# Patient Record
Sex: Male | Born: 1985 | Race: White | Hispanic: No | Marital: Single | Smoking: Never smoker
Health system: Southern US, Community
[De-identification: ages and names within clinical notes are randomized; demographics above are authoritative.]

## PROBLEM LIST (undated history)

## (undated) DIAGNOSIS — L709 Acne, unspecified: Secondary | ICD-10-CM

## (undated) HISTORY — PX: WISDOM TOOTH EXTRACTION: SHX21

## (undated) HISTORY — PX: LASIK: SHX215

---

## 2014-10-26 ENCOUNTER — Encounter: Payer: Self-pay | Admitting: *Deleted

## 2014-10-26 ENCOUNTER — Emergency Department
Admission: EM | Admit: 2014-10-26 | Discharge: 2014-10-26 | Disposition: A | Discharge status: Home / Self Care | Payer: Federal, State, Local not specified - PPO | Source: Home / Self Care | Attending: Family Medicine | Admitting: Family Medicine

## 2014-10-26 ENCOUNTER — Emergency Department (INDEPENDENT_AMBULATORY_CARE_PROVIDER_SITE_OTHER): Payer: Federal, State, Local not specified - PPO

## 2014-10-26 DIAGNOSIS — S7012XA Contusion of left thigh, initial encounter: Secondary | ICD-10-CM | POA: Diagnosis not present

## 2014-10-26 DIAGNOSIS — S5002XA Contusion of left elbow, initial encounter: Secondary | ICD-10-CM

## 2014-10-26 DIAGNOSIS — S7002XA Contusion of left hip, initial encounter: Secondary | ICD-10-CM

## 2014-10-26 DIAGNOSIS — M25522 Pain in left elbow: Secondary | ICD-10-CM | POA: Diagnosis not present

## 2014-10-26 HISTORY — DX: Acne, unspecified: L70.9

## 2014-10-26 NOTE — ED Notes (Signed)
Pt c/o LT arm, elbow and wrist pain post fall while running yesterday. He also reports a bruise on his LT hip.

## 2014-10-26 NOTE — Discharge Instructions (Signed)
Apply ice pack for 20 to 30 minutes, 3 to 4 times daily  Continue until swelling decreases.  Wear sling and splint until follow-up visit.  May take Ibuprofen , 4 tabs every 8 hours with food.    Elbow Contusion  An elbow contusion is a deep bruise of the elbow. Contusions happen when an injury causes bleeding under the skin. Signs of bruising include pain, puffiness (swelling), and discolored skin. The contusion may turn blue, purple, or yellow. HOME CARE  Put ice on the injured area.  Put ice in a plastic bag.  Place a towel between your skin and the bag.  Leave the ice on for 15-20 minutes, 03-04 times a day.  Only take medicines as told by your doctor.  Rest your elbow until the pain and puffiness are better.  Raise (elevate) your elbow to lessen puffiness.  Put on an elastic wrap as told by your doctor. You can take it off for sleeping, showers, and baths. If your fingers get cold, blue, or lose feeling (numb), take the wrap off. Put the wrap back on more loosely.  Use your elbow only as told by your doctor. If you are asked to do elbow exercises, do them as told.  Keep all doctor visits as told. GET HELP RIGHT AWAY IF:  You have more redness, puffiness, or pain in your elbow.  Your puffiness or pain is not helped by medicines.  You have puffiness of the hand and fingers.  You are not able to move your fingers or wrist.  You start to lose feeling in your hand or fingers.  Your fingers or hand become cold or blue. MAKE SURE YOU:   Understand these instructions.  Will watch your condition.  Will get help right away if you are not doing well or get worse. Document Released: 01/05/2011 Document Revised: 07/18/2011 Document Reviewed: 01/05/2011 [facility] Patient Information 2015 [place], [place]. This information is not intended to replace advice given to you by your health care provider. Make sure you discuss any questions you have with your health care  provider.   Hematoma A hematoma is a collection of blood under the skin, in an organ, in a body space, in a joint space, or in other tissue. The blood can clot to form a lump that you can see and feel. The lump is often firm and may sometimes become sore and tender. Most hematomas get better in a few days to weeks. However, some hematomas may be serious and require medical care. Hematomas can range in size from very small to very large. CAUSES  A hematoma can be caused by a blunt or penetrating injury. It can also be caused by spontaneous leakage from a blood vessel under the skin. Spontaneous leakage from a blood vessel is more likely to occur in older people, especially those taking blood thinners. Sometimes, a hematoma can develop after certain medical procedures. SIGNS AND SYMPTOMS   A firm lump on the body.  Possible pain and tenderness in the area.  Bruising.Blue, dark blue, purple-red, or yellowish skin may appear at the site of the hematoma if the hematoma is close to the surface of the skin. For hematomas in deeper tissues or body spaces, the signs and symptoms may be subtle. For example, an intra-abdominal hematoma may cause abdominal pain, weakness, fainting, and shortness of breath. An intracranial hematoma may cause a headache or symptoms such as weakness, trouble speaking, or a change in consciousness. DIAGNOSIS  A hematoma can usually  be diagnosed based on your medical history and a physical exam. Imaging tests may be needed if your health care provider suspects a hematoma in deeper tissues or body spaces, such as the abdomen, head, or chest. These tests may include ultrasonography or a CT scan.  TREATMENT  Hematomas usually go away on their own over time. Rarely does the blood need to be drained out of the body. Large hematomas or those that may affect vital organs will sometimes need surgical drainage or monitoring. HOME CARE INSTRUCTIONS   Apply ice to the injured area:    Put ice in a plastic bag.   Place a towel between your skin and the bag.   Leave the ice on for 20 minutes, 2-3 times a day for the first 1 to 2 days.   After the first 2 days, switch to using warm compresses on the hematoma.   Elevate the injured area to help decrease pain and swelling. Wrapping the area with an elastic bandage may also be helpful. Compression helps to reduce swelling and promotes shrinking of the hematoma. Make sure the bandage is not wrapped too tight.   If your hematoma is on a lower extremity and is painful, crutches may be helpful for a couple days.   Only take over-the-counter or prescription medicines as directed by your health care provider. SEEK IMMEDIATE MEDICAL CARE IF:   You have increasing pain, or your pain is not controlled with medicine.   You have a fever.   You have worsening swelling or discoloration.   Your skin over the hematoma breaks or starts bleeding.   Your hematoma is in your chest or abdomen and you have weakness, shortness of breath, or a change in consciousness.  Your hematoma is on your scalp (caused by a fall or injury) and you have a worsening headache or a change in alertness or consciousness. MAKE SURE YOU:   Understand these instructions.  Will watch your condition.  Will get help right away if you are not doing well or get worse. Document Released: 08/31/2003 Document Revised: 09/18/2012 Document Reviewed: 06/26/2012 [facility] Patient Information 2015 [place], [place]. This information is not intended to replace advice given to you by your health care provider. Make sure you discuss any questions you have with your health care provider.

## 2014-10-26 NOTE — ED Provider Notes (Signed)
CSN: [id number]     Arrival date & time 10/26/14  1322 History   First MD Initiated Contact with Patient 10/26/14 1415     Chief Complaint  Patient presents with  . Arm Injury  . Hip Pain      HPI Comments: While running on a baseball field yesterday, patient tripped and fell, landing on his left elbow and left hip. He has had persistent pain/swelling and decreased range of motion in his left elbow, and swelling over his left lateral thigh without decreased range of motion in his left hip.  Patient is a 29 y.o. male presenting with arm injury. The history is provided by the patient.  Arm Injury Location:  Elbow Time since incident:  1 day Injury: yes   Mechanism of injury: fall   Fall:    Fall occurred:  Running   Impact surface:  Dirt   Point of impact: left elbow and left hip. Elbow location:  L elbow Pain details:    Quality:  Aching   Radiates to:  Does not radiate   Severity:  Moderate   Onset quality:  Sudden   Duration:  1 day   Timing:  Constant   Progression:  Unchanged Chronicity:  New Prior injury to area:  No Relieved by:  Nothing Worsened by:  Movement Ineffective treatments:  Ice Associated symptoms: decreased range of motion, stiffness and swelling   Associated symptoms: no back pain, no muscle weakness, no numbness and no tingling     Past Medical History  Diagnosis Date  . Acne    Past Surgical History  Procedure Laterality Date  . Lasik    . Wisdom tooth extraction     Family History  Problem Relation Age of Onset  . Hypertension Father    Social History  Substance Use Topics  . Smoking status: Never Smoker   . Smokeless tobacco: None  . Alcohol Use: Yes    Review of Systems  Musculoskeletal: Positive for stiffness. Negative for back pain.  All other systems reviewed and are negative.   Allergies  Review of patient's allergies indicates no known allergies.  Home Medications   Prior to Admission medications   Medication Sig Start  Date End Date Taking? Authorizing Provider  clindamycin (CLEOCIN T) 1 % lotion Apply topically 2 (two) times daily.   Yes Historical Provider, MD  finasteride (PROPECIA) 1 MG tablet Take 1 mg by mouth daily.   Yes Historical Provider, MD  Fish Oil-Cholecalciferol (FISH OIL + D3 PO) Take by mouth.   Yes Historical Provider, MD  glucosamine-chondroitin 500-400 MG tablet Take 1 tablet by mouth 3 (three) times daily.   Yes Historical Provider, MD  ibuprofen (ADVIL,MOTRIN) 400 MG tablet Take 400 mg by mouth every 6 (six) hours as needed.   Yes Historical Provider, MD  Sulfacetamide-Sulfur in Urea [facility] EX) Apply topically.   Yes Historical Provider, MD  tretinoin (RETIN-A) 0.01 % gel Apply topically at bedtime.   Yes Historical Provider, MD   Meds Ordered and Administered this Visit  Medications - No data to display  BP 146/77 mmHg  Pulse 65  Temp(Src) 98.2 F (36.8 C) (Oral)  Resp 16  Ht  (1.778 m)  Wt 165 lb (74.844 kg)  BMI 23.68 kg/m2  SpO2 99% No data found.   Physical Exam  Constitutional: He is oriented to person, place, and time. He appears well-developed and well-nourished. No distress.  HENT:  Head: Atraumatic.  Eyes: Pupils are equal, round,  and reactive to light.  Pulmonary/Chest: No respiratory distress.  Musculoskeletal:       Left elbow: He exhibits decreased range of motion and swelling. He exhibits no deformity and no laceration. Tenderness found. Lateral epicondyle tenderness noted.       Left hip: He exhibits tenderness and swelling. He exhibits normal range of motion, normal strength, no bony tenderness, no crepitus, no deformity and no laceration.       Arms:      Legs: Left elbow has superficial minimal abrasion laterally  as noted on diagram.  There is swelling and tenderness to palpation laterally and over olecranon.  He is unable to fully extend elbow, and has difficulty with full flexion.  Distal neurovascular function is  intact.  Left lateral thigh has hematoma as noted on diagram, with swelling and tenderness to palpation.  Left hip has full range of motion.      Neurological: He is alert and oriented to person, place, and time.  Skin: Skin is warm and dry.  Nursing note and vitals reviewed.   ED Course  Procedures  None   Imaging Review Dg Elbow Complete Left  10/26/2014   CLINICAL DATA:  Fall.  EXAM: LEFT ELBOW - COMPLETE 3+ VIEW  COMPARISON:  None.  FINDINGS: There is elevation of the anterior fat pad. No fractures are identified. There is no dislocation noted. No radiopaque foreign bodies identified.  IMPRESSION: 1. No displaced fractures identified. 2. Elevation of the anterior fat pad which may be seen with fracture. If there is a concern for occult fracture then recommend repeat imaging in 7 to 10 days.   Electronically Signed   By: Signa Kell M.D.   On: 10/26/2014 15:01      MDM   1. Contusion of left elbow, initial encounter; concern for occult fracture   2. Contusion of left hip and thigh, initial encounter    Consulted with Dr. Clementeen Graham Posterior fiberglass splint fabricated and applied.  Dispensed sling  Apply ice pack for 20 to 30 minutes, 3 to 4 times daily  Continue until swelling decreases.  Wear sling and splint until follow-up visit.  May take Ibuprofen , 4 tabs every 8 hours with food.  Followup with Dr. Clementeen Graham ([facility]) in 4 days.  Lattie Haw, MD 10/27/14 [phone]

## 2014-10-30 ENCOUNTER — Encounter: Payer: Self-pay | Admitting: Family Medicine

## 2014-10-30 ENCOUNTER — Ambulatory Visit (INDEPENDENT_AMBULATORY_CARE_PROVIDER_SITE_OTHER): Payer: Federal, State, Local not specified - PPO | Admitting: Family Medicine

## 2014-10-30 ENCOUNTER — Ambulatory Visit (INDEPENDENT_AMBULATORY_CARE_PROVIDER_SITE_OTHER): Payer: Federal, State, Local not specified - PPO

## 2014-10-30 VITALS — BP 121/72 | HR 43 | Wt 163.0 lb

## 2014-10-30 DIAGNOSIS — S59902A Unspecified injury of left elbow, initial encounter: Secondary | ICD-10-CM

## 2014-10-30 DIAGNOSIS — M25522 Pain in left elbow: Secondary | ICD-10-CM

## 2014-10-30 NOTE — Assessment & Plan Note (Signed)
Doubtful for fx. Likely contusion. Return in 2 weeks. D/c post arm splint.  Work on ROM.  Avoid ballistic or heavy activity.

## 2014-10-30 NOTE — Patient Instructions (Signed)
Thank you for coming in today. Work on elbow range of motion.  I will call if I missed anything on the xray.  Return in 2 weeks.

## 2014-10-30 NOTE — Progress Notes (Signed)
   Subjective:    I'm seeing this patient as a consultation for:  Dr. Cathren Harsh  CC: Left Elbow Injury  HPI: Patient was running and fell on his left arm on September 25. He was seen in urgent care on the 26th with a abrasion and contusion to his left elbow. X-ray showed joint effusion concerning for occult fracture. He was placed into a long-arm splint and presents today for follow-up. He notes improved pain. He denies any radiating pain weakness or numbness fevers or chills. 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  Past medical history, Surgical history, Family history not pertinant except as noted below, Social history, Allergies, and medications have been entered into the medical record, reviewed, and no changes needed.   Review of Systems: No headache, visual changes, nausea, vomiting, diarrhea, constipation, dizziness, abdominal pain, skin rash, fevers, chills, night sweats, weight loss, swollen lymph nodes, body aches, joint swelling, muscle aches, chest pain, shortness of breath, mood changes, visual or auditory hallucinations.   Objective:    Filed Vitals:   10/30/14 1141  BP: 121/72  Pulse: 43   General: Well Developed, well nourished, and in no acute distress.  Neuro/Psych: Alert and oriented x3, extra-ocular muscles intact, able to move all 4 extremities, sensation grossly intact. Skin: Warm and dry, no rashes noted.  Respiratory: Not using accessory muscles, speaking in full sentences, trachea midline.  Cardiovascular: Pulses palpable, no extremity edema. Abdomen: Does not appear distended. MSK: Left elbow: abrasion healing lateral elbow. Elbow is non-tender. The elbow lacks 10 deg extension and supination ROM.  Wrist is non-tender with normal pulses and sensation.    Xray elbow pending: Prelim read shows no signifcant effusion or fractures. Awaiting formal read.    No results found for this or any previous visit (from the past 24 hour(s)). No  results found.  Impression and Recommendations:   This case required medical decision making of moderate complexity.

## 2014-11-12 ENCOUNTER — Ambulatory Visit: Payer: Federal, State, Local not specified - PPO | Admitting: Family Medicine

## 2014-11-13 ENCOUNTER — Ambulatory Visit: Payer: Federal, State, Local not specified - PPO | Admitting: Family Medicine

## 2014-11-16 ENCOUNTER — Encounter: Payer: Self-pay | Admitting: Family Medicine

## 2014-11-16 ENCOUNTER — Ambulatory Visit (INDEPENDENT_AMBULATORY_CARE_PROVIDER_SITE_OTHER): Payer: Federal, State, Local not specified - PPO | Admitting: Family Medicine

## 2014-11-16 VITALS — BP 118/72 | HR 46 | Wt 166.0 lb

## 2014-11-16 DIAGNOSIS — S59902D Unspecified injury of left elbow, subsequent encounter: Secondary | ICD-10-CM | POA: Diagnosis not present

## 2014-11-16 NOTE — Patient Instructions (Signed)
Return if not better 

## 2014-11-16 NOTE — Assessment & Plan Note (Signed)
Doing well. No x-rays at this time. Watchful waiting return to clinic as needed. Resume weight lifting.

## 2014-11-16 NOTE — Progress Notes (Signed)
Randall Mendez is a 29 y.o. male who [facility] for left elbow injury. Patient presents to clinic to follow-up left elbow injury. He said almost 100% better. He denies any pain and has full range of motion. He has not yet started weight lifting. No fevers chills nausea vomiting or diarrhea.   Past Medical History  Diagnosis Date  . Acne    Past Surgical History  Procedure Laterality Date  . Lasik    . Wisdom tooth extraction     Social History  Substance Use Topics  . Smoking status: Never Smoker   . Smokeless tobacco: Not on file  . Alcohol Use: Yes   family history includes Hypertension in his father.  ROS as above Medications: Current Outpatient Prescriptions  Medication Sig Dispense Refill  . aspirin 500 MG tablet Take 500 mg by mouth every 6 (six) hours as needed for pain.    . clindamycin (CLEOCIN T) 1 % lotion Apply topically 2 (two) times daily.    . finasteride (PROPECIA) 1 MG tablet Take 1 mg by mouth daily.    . Fish Oil-Cholecalciferol (FISH OIL + D3 PO) Take by mouth.    Marland Kitchen. glucosamine-chondroitin 500-400 MG tablet Take 1 tablet by mouth 3 (three) times daily.    Marland Kitchen. ibuprofen (ADVIL,MOTRIN) 400 MG tablet Take 400 mg by mouth every 6 (six) hours as needed.    . Sulfacetamide-Sulfur in Urea (SULFACETAMIDE-SULFUR WASH EX) Apply topically.    . tretinoin (RETIN-A) 0.01 % gel Apply topically at bedtime.     No current facility-administered medications for this visit.   No Known Allergies   Exam:  BP 118/72 mmHg  Pulse 46  Wt 166 lb (75.297 kg) Gen: Well NAD Left elbow normal-appearing nontender full motion and strength.  No results found for this or any previous visit (from the past 24 hour(s)). No results found.   Please see individual assessment and plan sections.

## 2014-12-07 ENCOUNTER — Ambulatory Visit: Payer: Federal, State, Local not specified - PPO

## 2016-07-13 IMAGING — CR DG ELBOW COMPLETE 3+V*L*
4 series · 4 of 4 positions shown · non-contrast
Comparison: 10/26/2014

CLINICAL DATA: Left elbow injury, fall 1 [REDACTED]. Continued
posterior pain.

EXAM:
LEFT ELBOW - COMPLETE 3+ VIEW

[elbow ap]
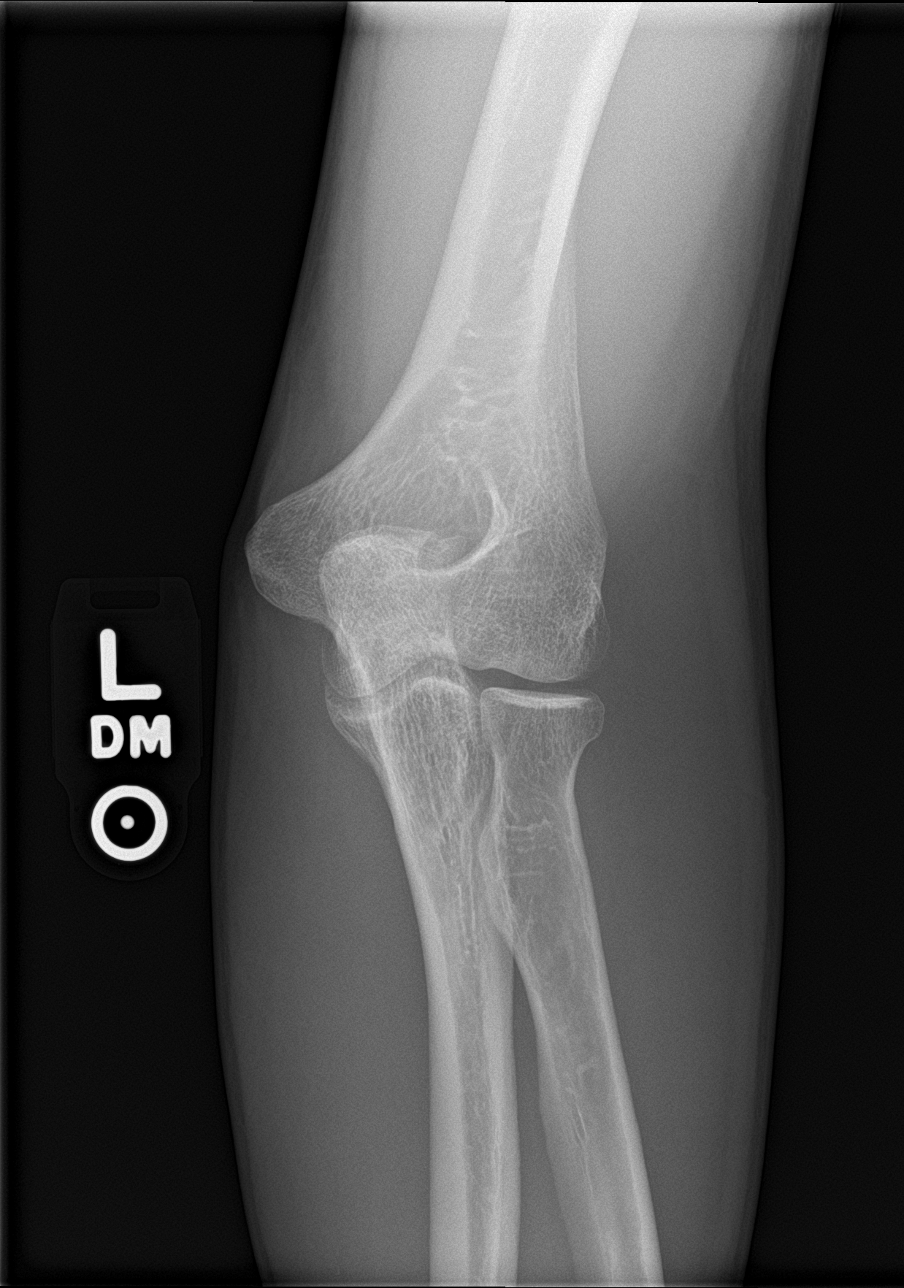

[elbow obl (1 of 2)]
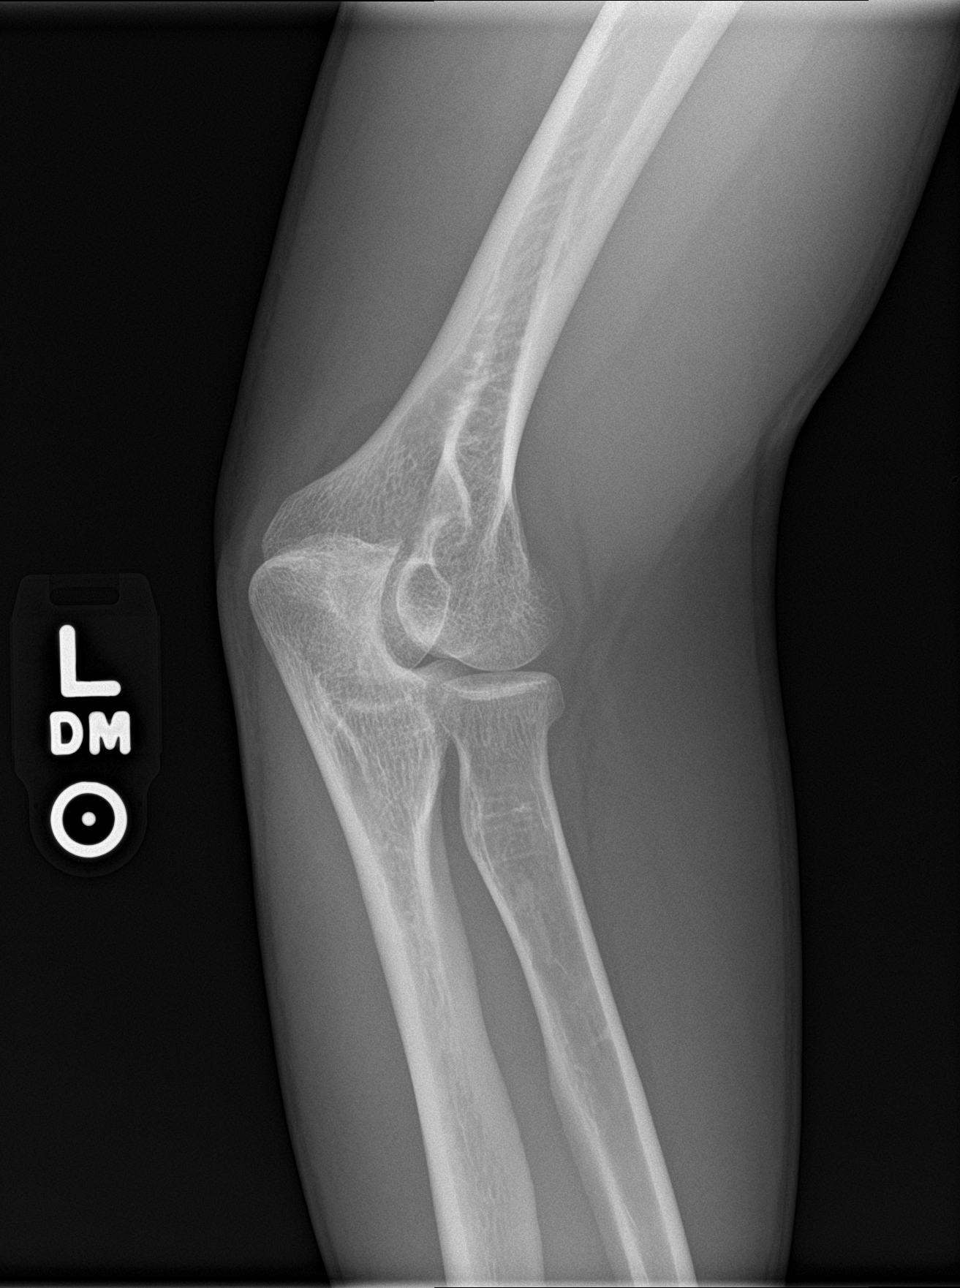

[elbow lat]
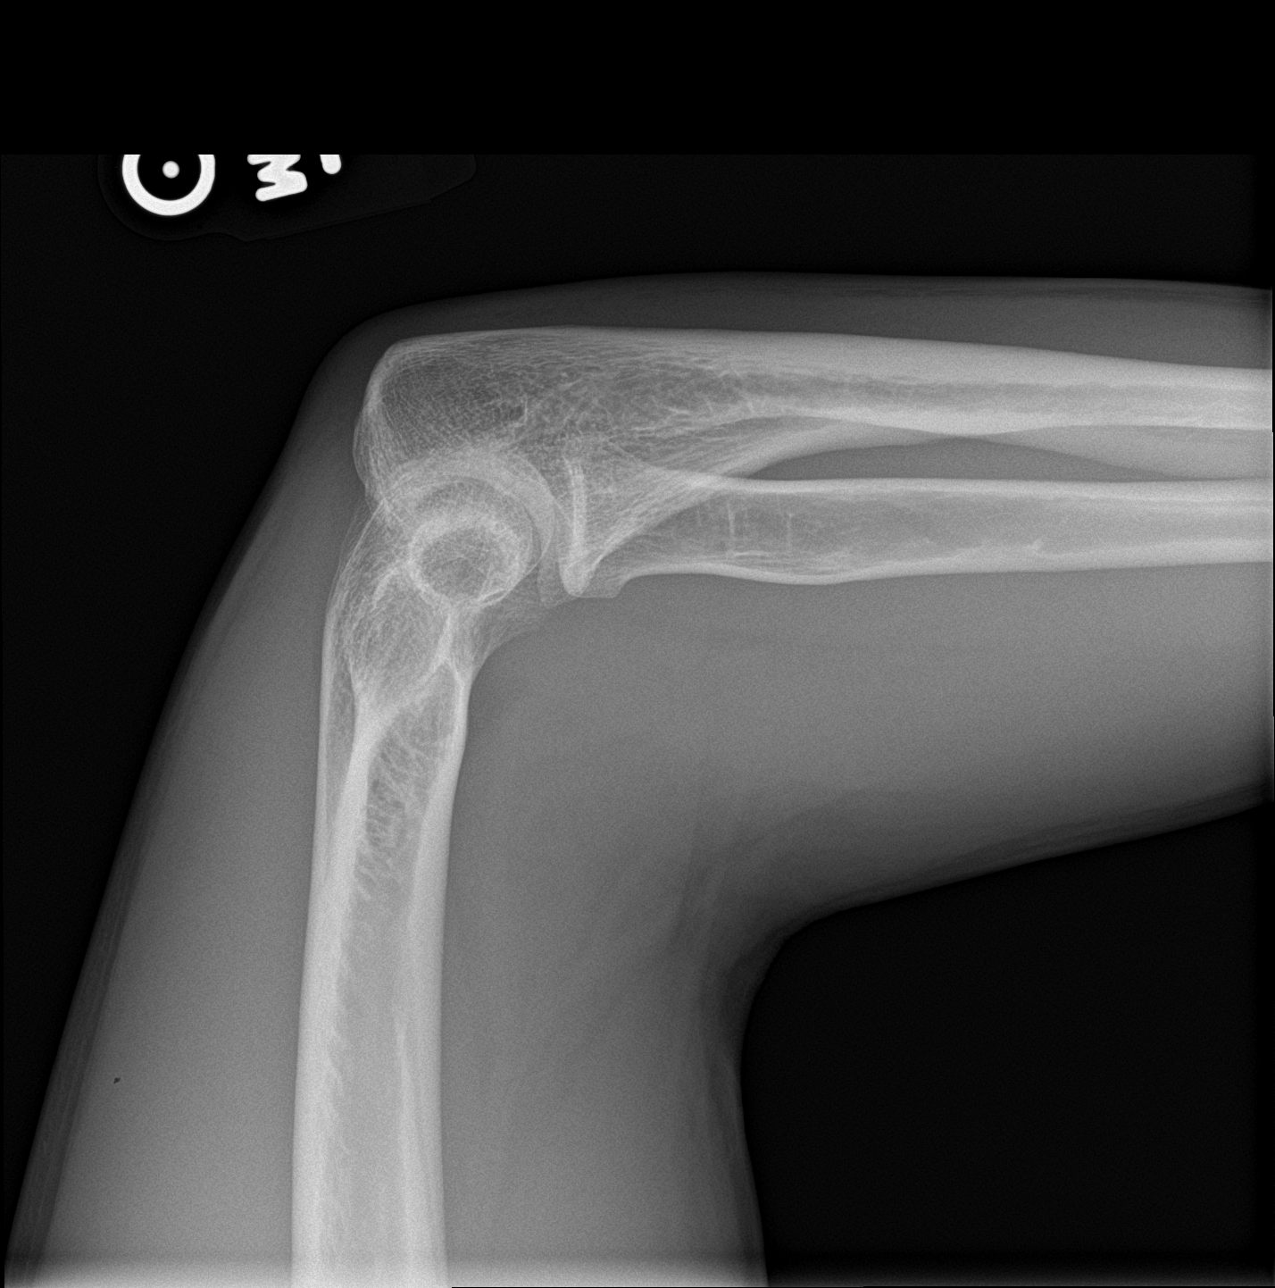

[elbow obl (2 of 2)]
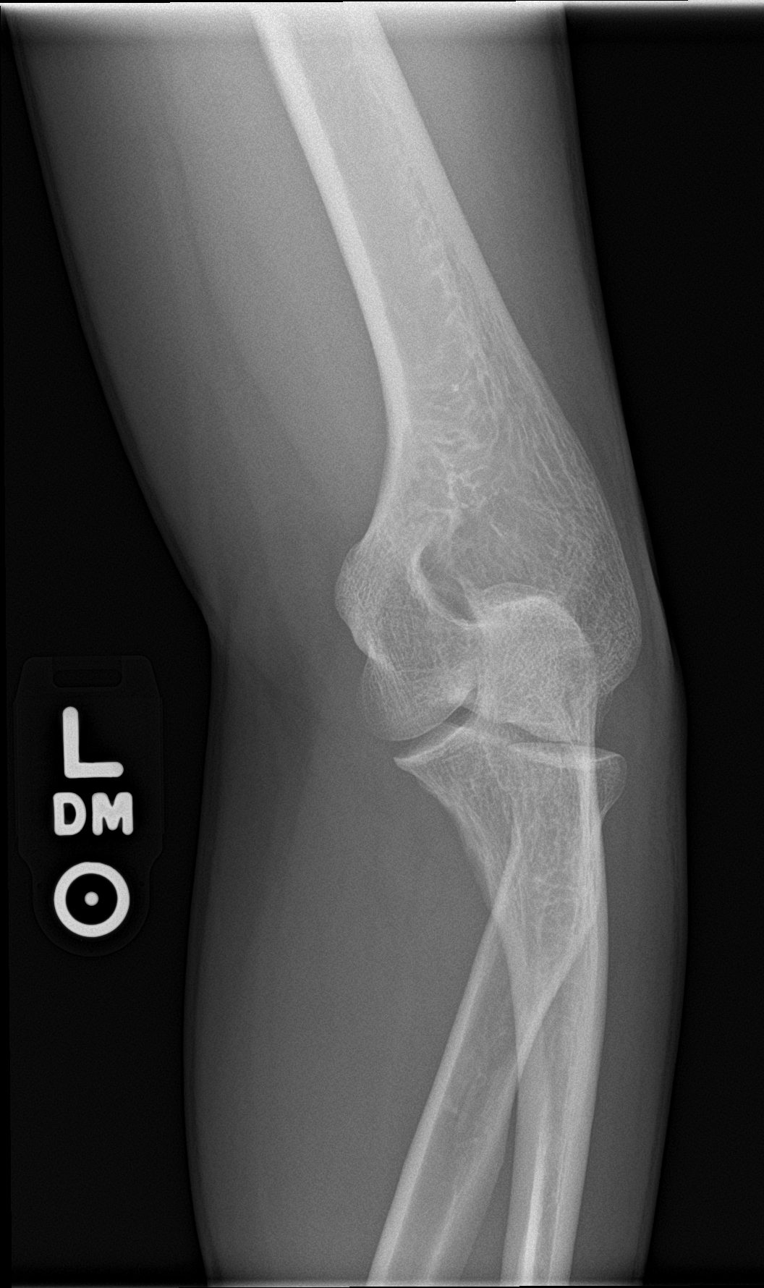

[4 of 4 positions shown; findings below may reference images not displayed]

FINDINGS: There is a left elbow joint effusion. No fracture visualized. No
subluxation or dislocation.
IMPRESSION: Continued left elbow joint effusion without visible fracture. It is
difficult to completely exclude occult fracture. Consider
immobilization and repeat imaging if symptoms persist.
